# Patient Record
Sex: Female | Born: 1975 | Race: Black or African American | Hispanic: No | Marital: Married | Smoking: Never smoker
Health system: Southern US, Community
[De-identification: ages and names within clinical notes are randomized; demographics above are authoritative.]

## PROBLEM LIST (undated history)

## (undated) HISTORY — PX: CHOLECYSTECTOMY: SHX55

## (undated) HISTORY — PX: TONSILLECTOMY: SUR1361

## (undated) HISTORY — PX: BACK SURGERY: SHX140

---

## 2007-12-06 ENCOUNTER — Emergency Department (HOSPITAL_BASED_OUTPATIENT_CLINIC_OR_DEPARTMENT_OTHER): Admission: EM | Admit: 2007-12-06 | Discharge: 2007-12-06 | Payer: Self-pay | Admitting: Emergency Medicine

## 2007-12-27 ENCOUNTER — Ambulatory Visit (HOSPITAL_COMMUNITY): Admission: RE | Admit: 2007-12-27 | Discharge: 2007-12-28 | Payer: Self-pay | Admitting: Neurosurgery

## 2010-08-12 NOTE — Op Note (Signed)
NAME:  Sonya Sweeney, Sonya Sweeney NO.:  [id number]   MEDICAL RECORD NO.:  [id number]          PATIENT TYPE:  OIB   LOCATION:  3535                         FACILITY:  [facility]   PHYSICIAN:  Clydene Fake, M.D.  DATE OF BIRTH:  1976-01-11   DATE OF PROCEDURE:  12/27/2007  DATE OF DISCHARGE:                               OPERATIVE REPORT   PREOPERATIVE DIAGNOSIS:  Herniated nucleosis pulposus, stenosis  spondylosis, L5-S1 with radiculopathy.   POSTOPERATIVE DIAGNOSIS:  Herniated nucleosis pulposus, stenosis  spondylosis, L5-S1 with radiculopathy.   PROCEDURE:  Decompressive laminectomy, decompression at L5 and S1 roots  (two levels), diskectomy, and microdissection.   SURGEON:  Clydene Fake, MD   ASSISTANT:  Payton Doughty, MD.   ANESTHESIA:  General endotracheal tube anesthesia.   BLOOD LOSS:  Minimal.   BLOOD GIVEN:  None.   DRAINS:  None.   COMPLICATIONS:  None.   REASON FOR PROCEDURE:  The patient is a 35 year old woman who has had  back and left leg pain associated with numbness.  MRI was done showing  extremely large central disk herniation with further extruded fragment  towards the left causing severe stenosis and root compression, and the  patient brought in for decompression.   PROCEDURE IN DETAIL:  The patient was brought into the operating room  and general anesthesia was induced.  The patient was placed in a prone  position on a Wilson frame with all pressure points padded.  The patient  was prepped and draped in sterile fashion.  General anesthesia injected  with 10 mL of 1% lidocaine with epinephrine.  Needle was placed in  interspace.  X-rays obtained showing the needle pointing  below the 5-1 space.  Incision was then made centered just above where  the needle was, incision taken down to the fascia.  Hemostasis obtained  with Bovie cauterization.  The fascia was incised on the left side  subperiosteal dissection was done over the L5-S1 spinous  process lamina  out to facet, markers placed in interspace.  X-rays were obtained  showing where the 5-1 interspace.  We exposed the right side and self-  retaining retractor was placed.  Microscope was brought in for  microdissection.  High-speed drill was used to start decompressive semi  hemilaminectomy on the left __________  medial facetectomy, removing the  __________ S1-L5 and removed ligamentum flavum and explored the disk  space.  Nerve root was under tension.  There was a huge disk herniation  underneath it.  It was calcified.  We were able to remove some free  fragmented disk from the axilla, and this loosened nerve root.  Rather  than  retract the nerve root over we ended up using a curette to get  through the calcified disk and even using a high-speed drill and then we  were able to use the curette to push some of the calcified disk and  towards the disk space and removed the soft disk with pituitary  rongeurs.  Attention was then taken to the right side where we did a  semi hemilaminectomy.  __________ drill,  Kerrison punches removing  ligament flavum and again found large calcified disk and we were able to  enter the disk space to perform diskectomy and there going back to the  left side still seemed to be a large piece of free fragments under the  thecal sac and we were finally able to find this and able to remove it  and this area freed up of all space, closed and cold blood decompression  and nevertheless take adequate decompression of central canal and  bilateral L5 and nerve roots were well decompressed.  Hemostasis  obtained with Gelfoam and thrombin and bipolar cauterization.  Gelfoam  was irrigated out.  We irrigated with antibiotic solution and the  retractors were removed.  Fascia closed with 0 Vicryl interrupted  sutures.  Subcutaneous tissue closed with 2-0 and 3-0 Vicryl interrupted  sutures.  Skin closed with benzoin and Steri-Strips.  The dressing was   placed.  The patient was placed back in the supine position, awaken from  anesthesia and transferred to the recovery room in stable condition.           ______________________________  Clydene Fake, M.D.     JRH/MEDQ  D:  12/27/2007  T:  12/28/2007  Job:  [id number]

## 2010-12-29 LAB — BASIC METABOLIC PANEL
BUN: 9
Calcium: 9.2
Chloride: 105
Creatinine, Ser: 0.65

## 2010-12-29 LAB — CBC
MCHC: 33.6
MCV: 85.7
Platelets: 243
WBC: 5.2

## 2010-12-29 LAB — PROTIME-INR: Prothrombin Time: 13.4

## 2010-12-29 LAB — APTT: aPTT: 33

## 2010-12-29 LAB — PREGNANCY, URINE: Preg Test, Ur: NEGATIVE

## 2015-10-16 ENCOUNTER — Emergency Department: Payer: BLUE CROSS/BLUE SHIELD

## 2015-10-16 ENCOUNTER — Emergency Department
Admission: EM | Admit: 2015-10-16 | Discharge: 2015-10-16 | Disposition: A | Discharge status: Home / Self Care | Payer: BLUE CROSS/BLUE SHIELD | Attending: Emergency Medicine | Admitting: Emergency Medicine

## 2015-10-16 DIAGNOSIS — R42 Dizziness and giddiness: Secondary | ICD-10-CM | POA: Diagnosis present

## 2015-10-16 DIAGNOSIS — J019 Acute sinusitis, unspecified: Secondary | ICD-10-CM | POA: Insufficient documentation

## 2015-10-16 LAB — COMPREHENSIVE METABOLIC PANEL
ALBUMIN: 4.2 g/dL (ref 3.5–5.0)
ALK PHOS: 39 U/L (ref 38–126)
ALT: 17 U/L (ref 14–54)
ANION GAP: 7 (ref 5–15)
AST: 20 U/L (ref 15–41)
BILIRUBIN TOTAL: 0.4 mg/dL (ref 0.3–1.2)
BUN: 10 mg/dL (ref 6–20)
CALCIUM: 8.9 mg/dL (ref 8.9–10.3)
CO2: 26 mmol/L (ref 22–32)
Chloride: 107 mmol/L (ref 101–111)
Creatinine, Ser: 0.69 mg/dL (ref 0.44–1.00)
GFR calc non Af Amer: >60 mL/min (ref >60)
Glucose, Bld: 127 mg/dL — ABNORMAL HIGH (ref 65–99)
POTASSIUM: 3.4 mmol/L — AB (ref 3.5–5.1)
Sodium: 140 mmol/L (ref 135–145)
TOTAL PROTEIN: 7.6 g/dL (ref 6.5–8.1)

## 2015-10-16 LAB — URINALYSIS COMPLETE WITH MICROSCOPIC (ARMC ONLY)
BILIRUBIN URINE: NEGATIVE
Bacteria, UA: NONE SEEN
GLUCOSE, UA: NEGATIVE mg/dL
KETONES UR: NEGATIVE mg/dL
LEUKOCYTES UA: NEGATIVE
NITRITE: NEGATIVE
PH: 7 (ref 5.0–8.0)
Protein, ur: NEGATIVE mg/dL
SPECIFIC GRAVITY, URINE: 1.017 (ref 1.005–1.030)

## 2015-10-16 LAB — CBC WITH DIFFERENTIAL/PLATELET
BASOS PCT: 1 %
Basophils Absolute: 0 10*3/uL (ref 0–0.1)
EOS ABS: 0.2 10*3/uL (ref 0–0.7)
Eosinophils Relative: 3 %
HEMATOCRIT: 41 % (ref 35.0–47.0)
HEMOGLOBIN: 14.1 g/dL (ref 12.0–16.0)
LYMPHS ABS: 2.3 10*3/uL (ref 1.0–3.6)
Lymphocytes Relative: 42 %
MCH: 29.7 pg (ref 26.0–34.0)
MCHC: 34.3 g/dL (ref 32.0–36.0)
MCV: 86.8 fL (ref 80.0–100.0)
MONO ABS: 0.3 10*3/uL (ref 0.2–0.9)
MONOS PCT: 6 %
NEUTROS ABS: 2.7 10*3/uL (ref 1.4–6.5)
NEUTROS PCT: 48 %
Platelets: 228 10*3/uL (ref 150–440)
RBC: 4.73 MIL/uL (ref 3.80–5.20)
RDW: 13.3 % (ref 11.5–14.5)
WBC: 5.6 10*3/uL (ref 3.6–11.0)

## 2015-10-16 MED ORDER — DIAZEPAM 5 MG PO TABS: 5.0000 mg | ORAL_TABLET | Freq: Three times a day (TID) | ORAL | Status: AC | PRN

## 2015-10-16 MED ORDER — PREDNISONE 10 MG (21) PO TBPK: ORAL_TABLET | ORAL | Status: AC

## 2015-10-16 MED ORDER — MECLIZINE HCL 25 MG PO TABS
50.0000 mg | ORAL_TABLET | Freq: Once | ORAL | Status: AC
  Administered 2015-10-16: 50 mg via ORAL
  Filled 2015-10-16: qty 2

## 2015-10-16 MED ORDER — DIAZEPAM 5 MG PO TABS
5.0000 mg | ORAL_TABLET | Freq: Once | ORAL | Status: AC
  Administered 2015-10-16: 5 mg via ORAL
  Filled 2015-10-16: qty 1

## 2015-10-16 MED ORDER — AZITHROMYCIN 250 MG PO TABS: ORAL_TABLET | ORAL | Status: AC

## 2015-10-16 MED ORDER — SODIUM CHLORIDE 0.9 % IV SOLN
1000.0000 mL | Freq: Once | INTRAVENOUS | Status: AC
  Administered 2015-10-16: 1000 mL via INTRAVENOUS

## 2015-10-16 MED ORDER — MECLIZINE HCL 25 MG PO TABS: 25.0000 mg | ORAL_TABLET | Freq: Three times a day (TID) | ORAL | Status: AC | PRN

## 2015-10-16 NOTE — ED Notes (Signed)
Pt to ED via EMS c/o dizziness. Per EMS [place] [address] and had to pull over several times because she was feeling increasingly dizzy. EMS stated at the scene pt was "laying all the way back in her drivers seat fanning herself". EMS stated pt ate this morning and CBG was 117.

## 2015-10-16 NOTE — ED Notes (Signed)
Patient at CT

## 2015-10-16 NOTE — Discharge Instructions (Signed)
Vertigo Vertigo means you feel like you or your surroundings are moving when they are not. Vertigo can be dangerous if it occurs when you are at work, driving, or performing difficult activities.  CAUSES  Vertigo occurs when there is a conflict of signals sent to your brain from the visual and sensory systems in your body. There are many different causes of vertigo, including:  Infections, especially in the inner ear.  A bad reaction to a drug or misuse of alcohol and medicines.  Withdrawal from drugs or alcohol.  Rapidly changing positions, such as lying down or rolling over in bed.  A migraine headache.  Decreased blood flow to the brain.  Increased pressure in the brain from a head injury, infection, tumor, or bleeding. SYMPTOMS  You may feel as though the world is spinning around or you are falling to the ground. Because your balance is upset, vertigo can cause nausea and vomiting. You may have involuntary eye movements (nystagmus). DIAGNOSIS  Vertigo is usually diagnosed by physical exam. If the cause of your vertigo is unknown, your caregiver may perform imaging tests, such as an MRI scan (magnetic resonance imaging). TREATMENT  Most cases of vertigo resolve on their own, without treatment. Depending on the cause, your caregiver may prescribe certain medicines. If your vertigo is related to body position issues, your caregiver may recommend movements or procedures to correct the problem. In rare cases, if your vertigo is caused by certain inner ear problems, you may need surgery. HOME CARE INSTRUCTIONS   Follow your caregiver's instructions.  Avoid driving.  Avoid operating heavy machinery.  Avoid performing any tasks that would be dangerous to you or others during a vertigo episode.  Tell your caregiver if you notice that certain medicines seem to be causing your vertigo. Some of the medicines used to treat vertigo episodes can actually make them worse in some people. SEEK  IMMEDIATE MEDICAL CARE IF:   Your medicines do not relieve your vertigo or are making it worse.  You develop problems with talking, walking, weakness, or using your arms, hands, or legs.  You develop severe headaches.  Your nausea or vomiting continues or gets worse.  You develop visual changes.  A family member notices behavioral changes.  Your condition gets worse. MAKE SURE YOU:  Understand these instructions.  Will watch your condition.  Will get help right away if you are not doing well or get worse.   This information is not intended to replace advice given to you by your health care provider. Make sure you discuss any questions you have with your health care provider.   Document Released: 12/24/2004 Document Revised: 06/08/2011 Document Reviewed: 07/09/2014 Elsevier Interactive Patient Education 2016 Elsevier Inc.  Dizziness Dizziness is a common problem. It is a feeling of unsteadiness or light-headedness. You may feel like you are about to faint. Dizziness can lead to injury if you stumble or fall. Anyone can become dizzy, but dizziness is more common in older adults. This condition can be caused by a number of things, including medicines, dehydration, or illness. HOME CARE INSTRUCTIONS Taking these steps may help with your condition: Eating and Drinking  Drink enough fluid to keep your urine clear or pale yellow. This helps to keep you from becoming dehydrated. Try to drink more clear fluids, such as water.  Do not drink alcohol.  Limit your caffeine intake if directed by your health care provider.  Limit your salt intake if directed by your health care provider. Activity  Avoid making quick movements.  Rise slowly from chairs and steady yourself until you feel okay.  In the morning, first sit up on the side of the bed. When you feel okay, stand slowly while you hold onto something until you know that your balance is fine.  Move your legs often if you need  to stand in one place for a long time. Tighten and relax your muscles in your legs while you are standing.  Do not drive or operate heavy machinery if you feel dizzy.  Avoid bending down if you feel dizzy. Place items in your home so that they are easy for you to reach without leaning over. Lifestyle  Do not use any tobacco products, including cigarettes, chewing tobacco, or electronic cigarettes. If you need help quitting, ask your health care provider.  Try to reduce your stress level, such as with yoga or meditation. Talk with your health care provider if you need help. General Instructions  Watch your dizziness for any changes.  Take medicines only as directed by your health care provider. Talk with your health care provider if you think that your dizziness is caused by a medicine that you are taking.  Tell a friend or a family member that you are feeling dizzy. If he or she notices any changes in your behavior, have this person call your health care provider.  Keep all follow-up visits as directed by your health care provider. This is important. SEEK MEDICAL CARE IF:  Your dizziness does not go away.  Your dizziness or light-headedness gets worse.  You feel nauseous.  You have reduced hearing.  You have new symptoms.  You are unsteady on your feet or you feel like the room is spinning. SEEK IMMEDIATE MEDICAL CARE IF:  You vomit or have diarrhea and are unable to eat or drink anything.  You have problems talking, walking, swallowing, or using your arms, hands, or legs.  You feel generally weak.  You are not thinking clearly or you have trouble forming sentences. It may take a friend or family member to notice this.  You have chest pain, abdominal pain, shortness of breath, or sweating.  Your vision changes.  You notice any bleeding.  You have a headache.  You have neck pain or a stiff neck.  You have a fever.   This information is not intended to replace  advice given to you by your health care provider. Make sure you discuss any questions you have with your health care provider.   Document Released: 09/09/2000 Document Revised: 07/31/2014 Document Reviewed: 03/12/2014 Elsevier Interactive Patient Education [employer]2016 Elsevier Inc.

## 2015-10-16 NOTE — ED Provider Notes (Addendum)
[facility] Emergency Department Provider Note        Time seen: ----------------------------------------- 9:18 AM on 10/16/2015 -----------------------------------------    I have reviewed the triage vital signs and the nursing notes.   HISTORY  Chief Complaint Dizziness    HPI Sonya Sweeney is a 40 y.o. female who presents to the ER for dizziness. Patient states she was driving on the highway and she had to pull over several times because she was feeling increasingly dizzy. EMS states that she was laying all the way back in the driver seat fanning herself. Patient states she's had these symptoms over the last week as well. She describes particular difficulty with walking downstairs where she feels that she's been a fall forward. She's also had these symptoms sitting at her desk, describes her vision becoming blurry, feeling like she can't breathe. She also describes feeling like she's can pass out.   History reviewed. No pertinent past medical history.  There are no active problems to display for this patient.   Past Surgical History  Procedure Laterality Date  . Cholecystectomy    . Back surgery    . Tonsillectomy      Allergies Penicillins  Social History Social History  Substance Use Topics  . Smoking status: Never Smoker   . Smokeless tobacco: None  . Alcohol Use: No    Review of Systems Constitutional: Negative for fever. Cardiovascular: Negative for chest pain. Respiratory: Positive for trouble breathing Gastrointestinal: Negative for abdominal pain, vomiting and diarrhea. Genitourinary: Negative for dysuria. Musculoskeletal: Negative for back pain. Skin: Negative for rash. Neurological: Positive for dizziness  10-point ROS otherwise negative.  ____________________________________________   PHYSICAL EXAM:  VITAL SIGNS: ED Triage Vitals  Enc Vitals Group     BP --      Pulse --      Resp --      Temp 10/16/15 0911 99  F (37.2 C)     Temp Source 10/16/15 0911 Oral     SpO2 --      Weight --      Height --      Head Cir --      Peak Flow --      Pain Score --      Pain Loc --      Pain Edu? --      Excl. in GC? --     Constitutional: Alert and oriented. Well appearing and in no distress. Eyes: Conjunctivae are normal. PERRL. Normal extraocular movements. ENT   Head: Normocephalic and atraumatic.   Nose: No congestion/rhinnorhea.   Mouth/Throat: Mucous membranes are moist.   Neck: No stridor. Cardiovascular: Normal rate, regular rhythm. No murmurs, rubs, or gallops. Respiratory: Normal respiratory effort without tachypnea nor retractions. Breath sounds are clear and equal bilaterally. No wheezes/rales/rhonchi. Gastrointestinal: Soft and nontender. Normal bowel sounds Musculoskeletal: Nontender with normal range of motion in all extremities. No lower extremity tenderness nor edema. Neurologic:  Normal speech and language. No gross focal neurologic deficits are appreciated. Strength, sensation, cranial nerves appear intact Skin:  Skin is warm, dry and intact. No rash noted. Psychiatric: Mood and affect are normal. Speech and behavior are normal.  ____________________________________________  EKG: Interpreted by me. Sinus rhythm with a rate of 70 bpm, normal PR interval, normal QRS, normal QT interval. Normal EKG.  ____________________________________________  ED COURSE:  Pertinent labs & imaging results that were available during my care of the patient were reviewed by me and considered in my medical  decision making (see chart for details). Patient presents to ER with nonspecific dizziness. I will check basic labs, CT imaging. She will receive meclizine and Valium. ____________________________________________    LABS (pertinent positives/negatives)  Labs Reviewed  COMPREHENSIVE METABOLIC PANEL - Abnormal; Notable for the following:    Potassium 3.4 (*)    Glucose, Bld 127 (*)     All other components within normal limits  URINALYSIS COMPLETEWITH MICROSCOPIC ([facility] ONLY) - Abnormal; Notable for the following:    Color, Urine YELLOW (*)    APPearance CLEAR (*)    Hgb urine dipstick 2+ (*)    Squamous Epithelial / LPF 0-5 (*)    All other components within normal limits  CBC WITH DIFFERENTIAL/PLATELET    RADIOLOGY Images were viewed by me  CT head IMPRESSION: Mild sphenoid paranasal sinus disease and mild opacification of several inferior mastoid air cells on the left. No intracranial mass, hemorrhage, or focal gray - white compartment lesion.  ____________________________________________  FINAL ASSESSMENT AND PLAN  Dizziness, Sinusitis  Plan: Patient with labs and imaging as dictated above. Patient was able to ambulate here and states she feels better. She has also been under a lot of stress, was taking her daughter to college for the first time. She'll be discharged with several medications including Azithromycin, prednisone, meclizine and Valium. She'll be referred to ENT for close outpatient follow-up.   Emily Filbert, MD   Note: This dictation was prepared with Dragon dictation. Any transcriptional errors that result from this process are unintentional   Emily Filbert, MD 10/16/15 1124  Emily Filbert, MD 10/16/15 1125

## 2015-10-16 NOTE — ED Notes (Signed)
MD at bedside. 

## 2017-05-12 ENCOUNTER — Emergency Department (HOSPITAL_COMMUNITY)
Admission: EM | Admit: 2017-05-12 | Discharge: 2017-05-13 | Disposition: A | Discharge status: Home / Self Care | Payer: BLUE CROSS/BLUE SHIELD | Attending: Emergency Medicine | Admitting: Emergency Medicine

## 2017-05-12 ENCOUNTER — Emergency Department (HOSPITAL_COMMUNITY): Payer: BLUE CROSS/BLUE SHIELD

## 2017-05-12 ENCOUNTER — Encounter (HOSPITAL_COMMUNITY): Payer: Self-pay | Admitting: Emergency Medicine

## 2017-05-12 ENCOUNTER — Other Ambulatory Visit: Payer: Self-pay

## 2017-05-12 DIAGNOSIS — M79601 Pain in right arm: Secondary | ICD-10-CM | POA: Diagnosis not present

## 2017-05-12 DIAGNOSIS — Z5321 Procedure and treatment not carried out due to patient leaving prior to being seen by health care provider: Secondary | ICD-10-CM | POA: Diagnosis not present

## 2017-05-12 DIAGNOSIS — R51 Headache: Secondary | ICD-10-CM | POA: Diagnosis present

## 2017-05-12 DIAGNOSIS — R519 Headache, unspecified: Secondary | ICD-10-CM

## 2017-05-12 NOTE — ED Provider Notes (Cosign Needed)
Patient placed in Quick Look pathway, seen and evaluated   Chief Complaint: Headache, right arm pain  HPI:   Meridee ScoreJoanne Surges is a 42 y.o. female who presents to the Emergency Department complaining of right-sided headache which began 5 days ago. She took Excerdrin migraine and pain improved, but then returned and is now bilateral. It has hurt daily. She then began experiencing pain and tingling down the right side of her arm. She feels as if this has improved, but feels an itching sensation to her right arm now. Denies any history of headaches in the past.   ROS: + headaches, myalgias, tingling   - fever, visual changes, weakness  Physical Exam:   Gen: No distress  Skin: Warm     Focused Exam:    Neuro: Speech clear and goal oriented. CN 2-12 grossly intact. Normal finger-to-nose and rapid alternating movements. No drift. Strength and sensation intact. Steady gait.    Neck: Supple, mild tenderness to cervical spinous processes.   Initiation of care has begun. The patient has been counseled on the process, plan, and necessity for staying for the completion/evaluation, and the remainder of the medical screening examination  Given patient has no history of migrainous headaches in the past, will obtain CT head. She did have mild midline cervical tenderness and experiencing right arm pain / tingling, therefore CT neck obtained as well. Offered patient medication for symptom improvement while waiting, however she declined.     Mujtaba Bollig, Chase PicketJaime Pilcher, PA-C 05/12/17 [id number]

## 2017-05-12 NOTE — ED Notes (Signed)
No answer x2 

## 2017-05-12 NOTE — ED Notes (Signed)
No answer x 2 when called by Eulis FosterAlberto, NT.

## 2017-05-12 NOTE — ED Triage Notes (Signed)
Pt presents with ongoing headache and tingling that began on Saturday; pt states headache is R sided and tingling in the R arm and hand; pt states she took some migraine medication but didn't help

## 2017-05-14 NOTE — ED Notes (Signed)
05/14/2017, Follow-up call completed. She will be following up with her PCP.

## 2019-01-05 ENCOUNTER — Other Ambulatory Visit: Payer: Self-pay

## 2019-01-05 DIAGNOSIS — Z20822 Contact with and (suspected) exposure to covid-19: Secondary | ICD-10-CM

## 2019-01-07 LAB — NOVEL CORONAVIRUS, NAA: SARS-CoV-2, NAA: NOT DETECTED

## 2019-06-09 IMAGING — CT CT CERVICAL SPINE W/O CM
3 of 8 series · 11 of 33 positions shown, 12 images · non-contrast
Comparison: Head CT 10/16/2015.

CLINICAL DATA: 41-year-old female with history of numbness and
tingling in the right arm.

EXAM:
CT HEAD WITHOUT CONTRAST
CT CERVICAL SPINE WITHOUT CONTRAST
TECHNIQUE: Multidetector CT imaging of the head and cervical spine was
performed following the standard protocol without intravenous
contrast. Multiplanar CT image reconstructions of the cervical spine
were also generated.

[Series 10: sag bone · sagittal · 0.21mm/px · 5 of 61 slices shown]
[im 11/61  bone]
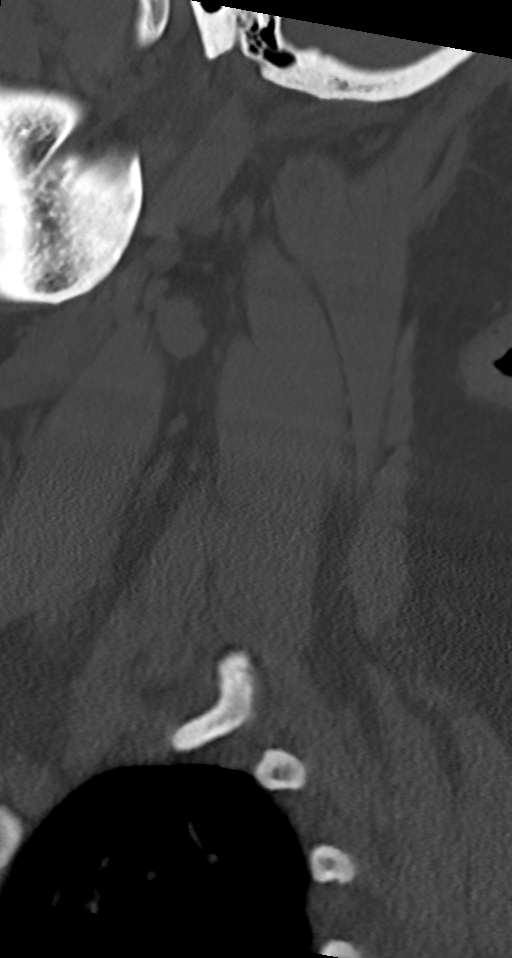
[im 21/61  bone]
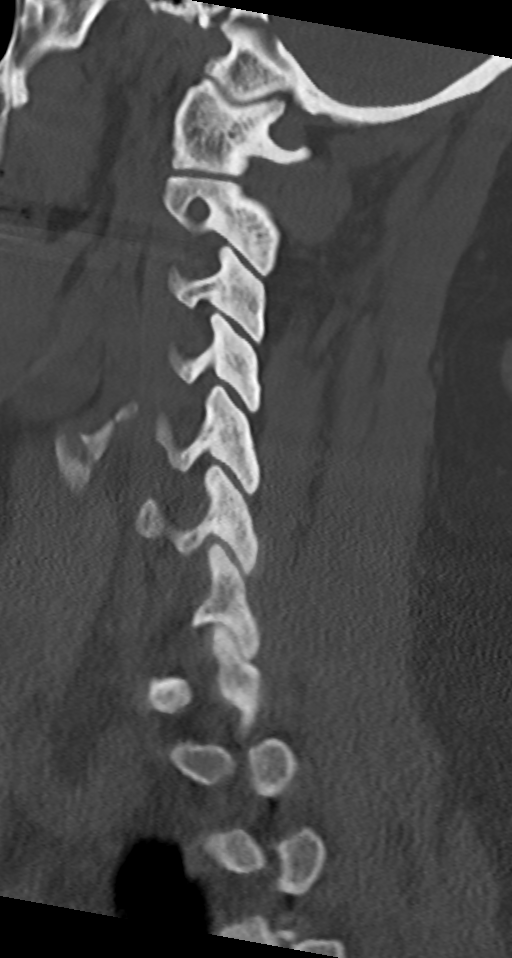
[im 31/61  bone]
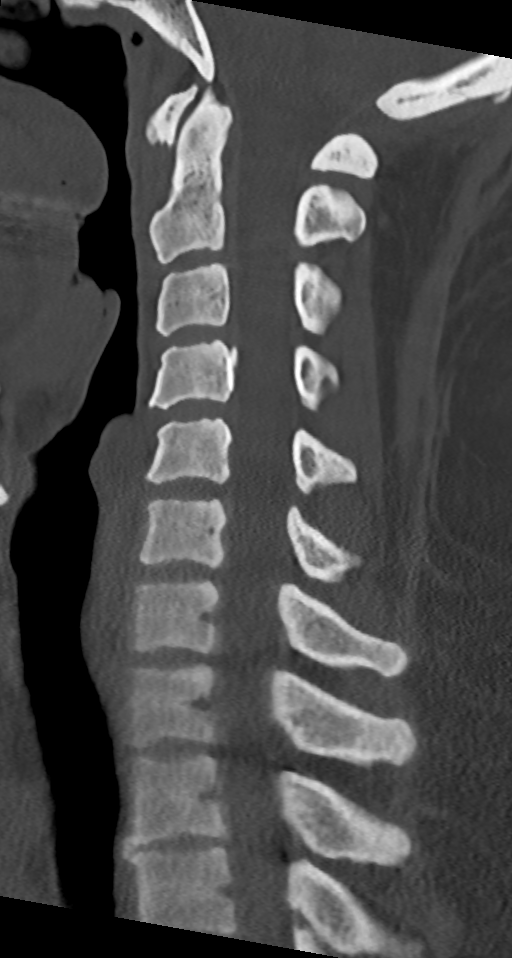
[im 41/61  bone]
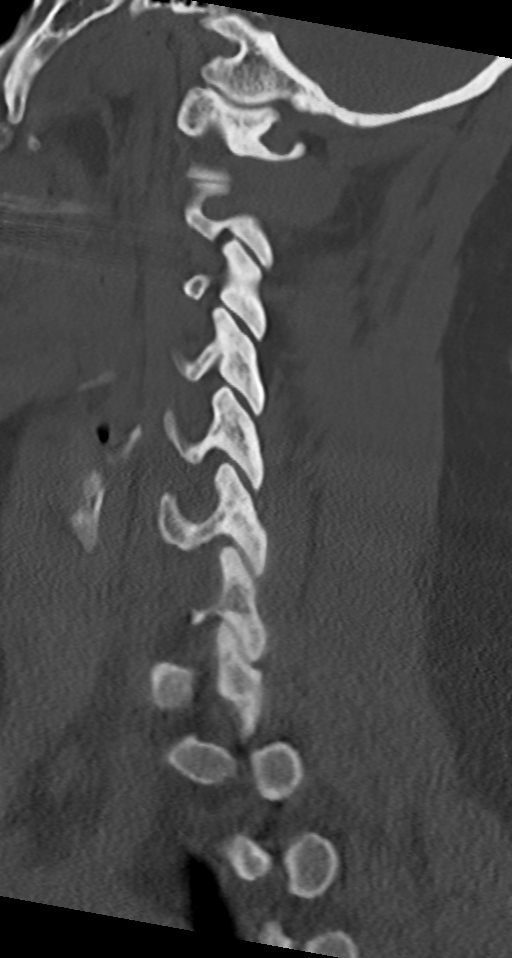
[im 51/61  bone]
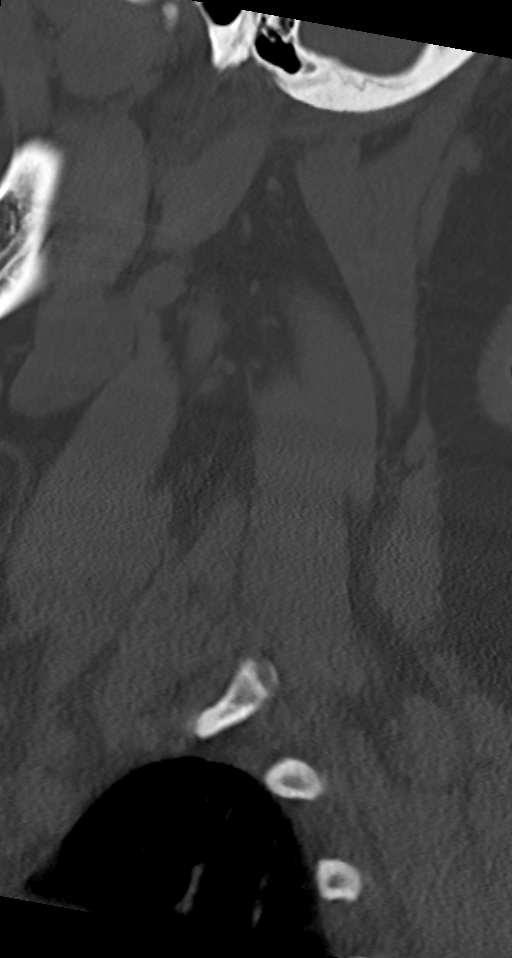

[Series 11: cor bone · coronal · 0.22mm/px · 3 of 61 slices shown]
[im 16/61  bone]
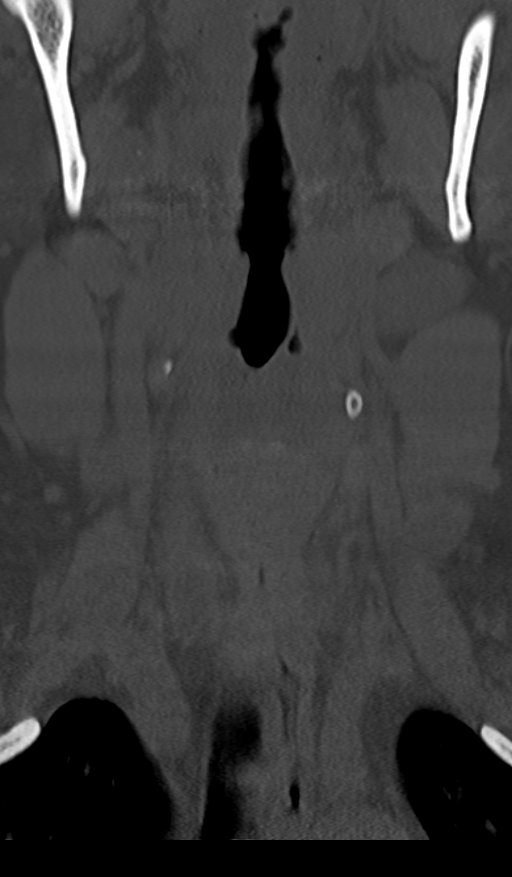
[im 31/61  bone]
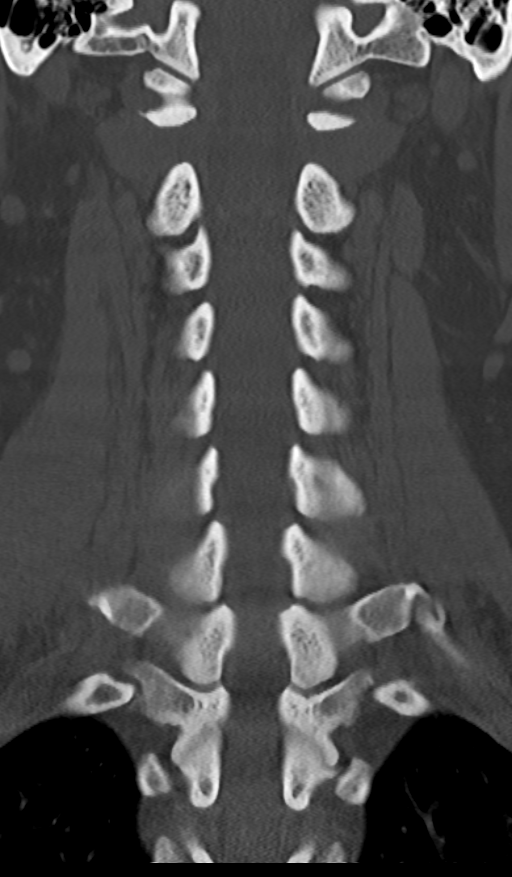
[im 46/61  bone]
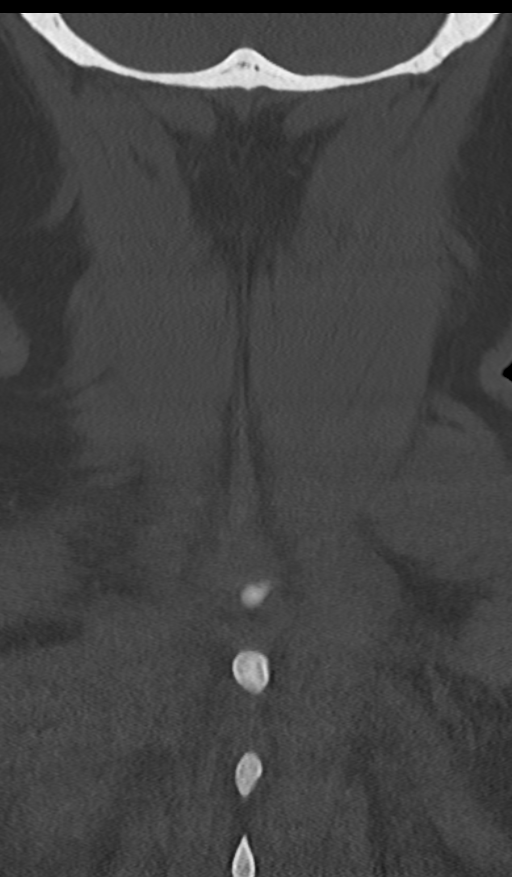

[Series 12: orthogonal axials · axial · 0.21mm/px · z∈[-209,-115]mm · 3 of 101 slices shown, 4 images]
[im 26/101  soft-tissue]
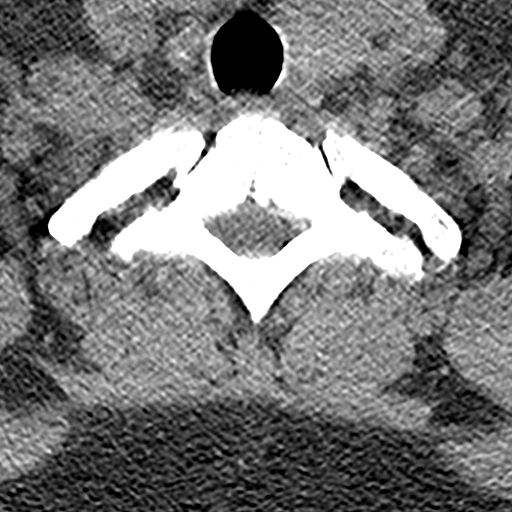
[im 26/101  bone]
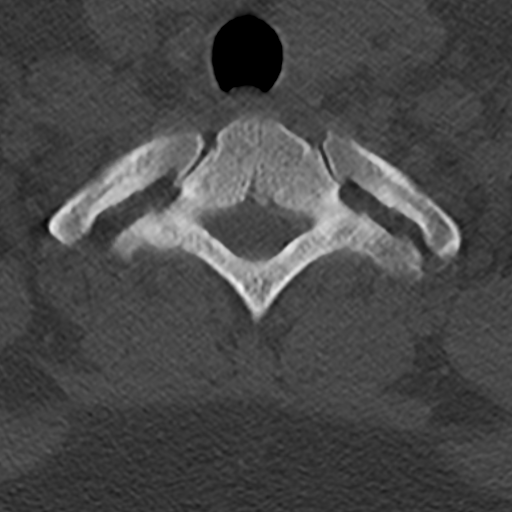
[im 51/101  bone]
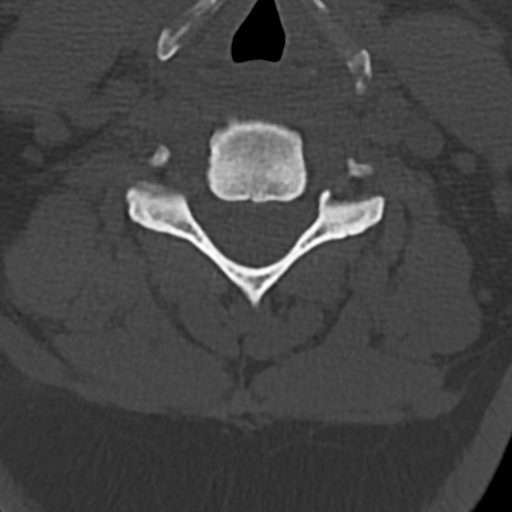
[im 76/101  bone]
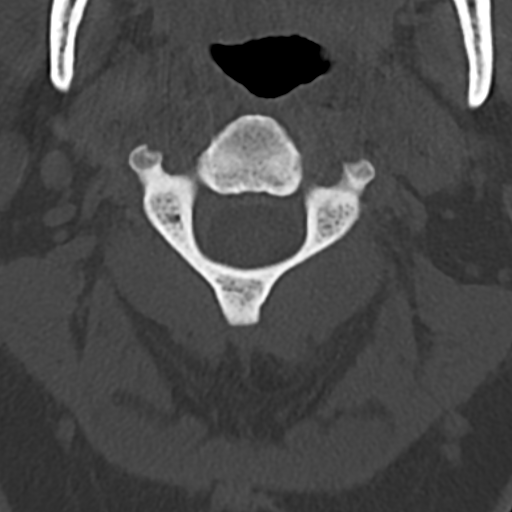

[11 of 33 positions shown; findings below may reference images not displayed]

FINDINGS: CT HEAD FINDINGS

Brain: No evidence of acute infarction, hemorrhage, hydrocephalus,
extra-axial collection or mass lesion/mass effect.

Vascular: No hyperdense vessel or unexpected calcification.

Skull: Normal. Negative for fracture or focal lesion.

Sinuses/Orbits: No acute finding.

Other: None.

CT CERVICAL SPINE FINDINGS

Alignment: Normal.

Skull base and vertebrae: No acute fracture. No primary bone lesion
or focal pathologic process.

Soft tissues and spinal canal: No prevertebral fluid or swelling. No
visible canal hematoma.

Disc levels:  No significant degenerative disc disease.

Upper chest: Visualized portions are unremarkable.

Other: None.
IMPRESSION: 1. No acute abnormality of the skull, brain or cervical spine.
2. The appearance of the brain is normal.

## 2020-10-23 ENCOUNTER — Encounter (HOSPITAL_COMMUNITY): Payer: Self-pay

## 2020-10-23 ENCOUNTER — Other Ambulatory Visit: Payer: Self-pay

## 2020-10-23 ENCOUNTER — Ambulatory Visit (HOSPITAL_COMMUNITY)
Admission: EM | Admit: 2020-10-23 | Discharge: 2020-10-23 | Disposition: A | Discharge status: Home / Self Care | Payer: BLUE CROSS/BLUE SHIELD | Attending: Medical Oncology | Admitting: Medical Oncology

## 2020-10-23 DIAGNOSIS — N898 Other specified noninflammatory disorders of vagina: Secondary | ICD-10-CM | POA: Insufficient documentation

## 2020-10-23 MED ORDER — METRONIDAZOLE 500 MG PO TABS: 500.0000 mg | ORAL_TABLET | Freq: Two times a day (BID) | ORAL | 0 refills | Status: AC

## 2020-10-23 NOTE — ED Triage Notes (Signed)
Pt presents with c/o vaginal itching x 1 week.  Denies vaginal smell and burning.   States she tried Monistat and states it made the itching worse.

## 2020-10-23 NOTE — ED Provider Notes (Signed)
[facility]    CSN: [id number] Arrival date & time: 10/23/20  1748      History   Chief Complaint Chief Complaint  Patient presents with   Vaginal Itching    HPI Sonya Sweeney is a 45 y.o. female.   HPI  Vaginal Itching: Patient states that she has had 1 week history of vaginal itching and irritation.  She reports that she tried an over-the-counter Monistat treatment course but symptoms appear to worsen.  She denies any dysuria, pelvic pain or fevers.  No sexual partners of concern but wants STI testing.  History reviewed. No pertinent past medical history.  There are no problems to display for this patient.   Past Surgical History:  Procedure Laterality Date   BACK SURGERY     CHOLECYSTECTOMY     TONSILLECTOMY      OB History   No obstetric history on file.      Home Medications    Prior to Admission medications   Medication Sig Start Date End Date Taking? Authorizing Provider  azithromycin (ZITHROMAX Z-PAK) 250 MG tablet Take 2 tablets (500 mg) on  Day 1,  followed by 1 tablet (250 mg) once daily on Days 2 through 5. 10/16/15   Emily Filbert, MD  diazepam (VALIUM) 5 MG tablet Take 1 tablet (5 mg total) by mouth every 8 (eight) hours as needed (To be taken with meclizine for vertigo). 10/16/15   Emily Filbert, MD  meclizine (ANTIVERT) 25 MG tablet Take 1 tablet (25 mg total) by mouth 3 (three) times daily as needed for dizziness or nausea. 10/16/15   Emily Filbert, MD  predniSONE (STERAPRED UNI-PAK 21 TAB) 10 MG (21) TBPK tablet Dispense steroid taper pack as directed 10/16/15   Emily Filbert, MD    Family History Family History  Problem Relation Age of Onset   Hypertension Mother     Social History Social History   Tobacco Use   Smoking status: Never   Smokeless tobacco: Never  Substance Use Topics   Alcohol use: No   Drug use: No     Allergies   Penicillins   Review of Systems Review of Systems  As  stated above in HPI Physical Exam Triage Vital Signs ED Triage Vitals  Enc Vitals Group     BP 10/23/20 1835 (!) 157/104     Pulse Rate 10/23/20 1834 84     Resp 10/23/20 1834 17     Temp 10/23/20 1834 98.9 F (37.2 C)     Temp Source 10/23/20 1834 Oral     SpO2 10/23/20 1834 100 %     Weight --      Height --      Head Circumference --      Peak Flow --      Pain Score 10/23/20 1833 0     Pain Loc --      Pain Edu? --      Excl. in GC? --    No data found.  Updated Vital Signs BP (!) 157/104 (BP Location: Right Arm) Comment: Pt states she ate pork today.  Pulse 84   Temp 98.9 F (37.2 C) (Oral)   Resp 17   LMP 10/16/2015   SpO2 100%   Physical Exam Vitals and nursing note reviewed.  Constitutional:      General: She is not in acute distress.    Appearance: Normal appearance. She is not ill-appearing, toxic-appearing or diaphoretic.  Genitourinary:  Comments: Pt perform self swab collection prior to visit  Neurological:     Mental Status: She is alert.     UC Treatments / Results  Labs (all labs ordered are listed, but only abnormal results are displayed) Labs Reviewed - No data to display  EKG   Radiology No results found.  Procedures Procedures (including critical care time)  Medications Ordered in UC Medications - No data to display  Initial Impression / Assessment and Plan / UC Course  I have reviewed the triage vital signs and the nursing notes.  Pertinent labs & imaging results that were available during my care of the patient were reviewed by me and considered in my medical decision making (see chart for details).     New.  Likely bacterial vaginosis given that symptoms worsen with the Monistat and she has no known new partners of concern.  I discussed waiting until results are back versus treating now.  She elects treatment now.  Treating with Flagyl.  Discussed how to use along common potential side effects and precautions.  Discussed red  flag signs symptoms. Final Clinical Impressions(s) / UC Diagnoses   Final diagnoses:  None   Discharge Instructions   None    ED Prescriptions   None    PDMP not reviewed this encounter.   Rushie Chestnut, [place] 10/23/20 1920

## 2020-10-25 ENCOUNTER — Telehealth (HOSPITAL_COMMUNITY): Payer: Self-pay | Admitting: Emergency Medicine

## 2020-10-25 LAB — CERVICOVAGINAL ANCILLARY ONLY
Bacterial Vaginitis (gardnerella): NEGATIVE
Candida Glabrata: NEGATIVE
Candida Vaginitis: POSITIVE — AB
Chlamydia: NEGATIVE
Comment: NEGATIVE
Comment: NEGATIVE
Comment: NEGATIVE
Comment: NEGATIVE
Comment: NEGATIVE
Comment: NORMAL
Neisseria Gonorrhea: NEGATIVE
Trichomonas: NEGATIVE

## 2020-10-25 MED ORDER — FLUCONAZOLE 150 MG PO TABS: 150.0000 mg | ORAL_TABLET | Freq: Once | ORAL | 0 refills | Status: AC

## 2022-09-27 ENCOUNTER — Emergency Department (HOSPITAL_COMMUNITY): Payer: BC Managed Care – PPO

## 2022-09-27 ENCOUNTER — Encounter (HOSPITAL_COMMUNITY): Payer: Self-pay

## 2022-09-27 ENCOUNTER — Emergency Department (HOSPITAL_COMMUNITY)
Admission: EM | Admit: 2022-09-27 | Discharge: 2022-09-27 | Disposition: A | Discharge status: Home / Self Care | Payer: BC Managed Care – PPO | Attending: Emergency Medicine | Admitting: Emergency Medicine

## 2022-09-27 DIAGNOSIS — I10 Essential (primary) hypertension: Secondary | ICD-10-CM | POA: Diagnosis not present

## 2022-09-27 DIAGNOSIS — M542 Cervicalgia: Secondary | ICD-10-CM | POA: Diagnosis not present

## 2022-09-27 DIAGNOSIS — R519 Headache, unspecified: Secondary | ICD-10-CM | POA: Insufficient documentation

## 2022-09-27 DIAGNOSIS — Z79899 Other long term (current) drug therapy: Secondary | ICD-10-CM | POA: Insufficient documentation

## 2022-09-27 LAB — I-STAT CHEM 8, ED
BUN: 11 mg/dL (ref 6–20)
Calcium, Ion: 1.16 mmol/L (ref 1.15–1.40)
Chloride: 104 mmol/L (ref 98–111)
Creatinine, Ser: 0.8 mg/dL (ref 0.44–1.00)
Glucose, Bld: 122 mg/dL — ABNORMAL HIGH (ref 70–99)
HCT: 36 % (ref 36.0–46.0)
Hemoglobin: 12.2 g/dL (ref 12.0–15.0)
Potassium: 3.1 mmol/L — ABNORMAL LOW (ref 3.5–5.1)
Sodium: 143 mmol/L (ref 135–145)
TCO2: 25 mmol/L (ref 22–32)

## 2022-09-27 MED ORDER — AMLODIPINE BESYLATE 5 MG PO TABS: 5.0000 mg | ORAL_TABLET | Freq: Every day | ORAL | 0 refills | Status: AC

## 2022-09-27 MED ORDER — HYDROCODONE-ACETAMINOPHEN 5-325 MG PO TABS
1.0000 | ORAL_TABLET | Freq: Once | ORAL | Status: AC
  Administered 2022-09-27: 1 via ORAL
  Filled 2022-09-27: qty 1

## 2022-09-27 MED ORDER — IOHEXOL 350 MG/ML SOLN
80.0000 mL | Freq: Once | INTRAVENOUS | Status: AC | PRN
  Administered 2022-09-27: 80 mL via INTRAVENOUS

## 2022-09-27 NOTE — Discharge Instructions (Signed)
You can use Tylenol and ibuprofen for symptomatic relief.  Follow-up with your primary care doctor this week to recheck her blood pressure and recheck the sore spot on your head.  Return to the emergency room if you have any worsening symptoms.

## 2022-09-27 NOTE — ED Notes (Signed)
Dr Fredderick Phenix is aware of patient's elevated BP of 197/113 and pain in the back of her head.

## 2022-09-27 NOTE — ED Provider Notes (Signed)
[place] EMERGENCY DEPARTMENT AT [facility] ([place]) Provider Note   CSN: [id number] Arrival date & time: 09/27/22  1409     History  Chief Complaint  Patient presents with   Neck Pain    Sonya Sweeney is a 47 y.o. female.  Patient is a 47 year old female who presents with head and neck pain.  She said that she woke up 2 days ago with some pain in the back of her head.  She says it is sore and it seems to radiate down her neck although she cannot reproduce the tenderness in her neck.  She says it has been getting worse over the last 2 days.  She has some worsening pain with certain movements.  She denies any vision changes.  No radiation down her arms.  No numbness or weakness in her arms.  No numbness to her face.  No known injuries.       Home Medications Prior to Admission medications   Medication Sig Start Date End Date Taking? Authorizing Provider  amLODipine (NORVASC) 5 MG tablet Take 1 tablet (5 mg total) by mouth daily. 09/27/22  Yes Rolan Bucco, MD  azithromycin (ZITHROMAX Z-PAK) 250 MG tablet Take 2 tablets (500 mg) on  Day 1,  followed by 1 tablet (250 mg) once daily on Days 2 through 5. 10/16/15   Emily Filbert, MD  diazepam (VALIUM) 5 MG tablet Take 1 tablet (5 mg total) by mouth every 8 (eight) hours as needed (To be taken with meclizine for vertigo). 10/16/15   Emily Filbert, MD  meclizine (ANTIVERT) 25 MG tablet Take 1 tablet (25 mg total) by mouth 3 (three) times daily as needed for dizziness or nausea. 10/16/15   Emily Filbert, MD  metroNIDAZOLE (FLAGYL) 500 MG tablet Take 1 tablet (500 mg total) by mouth 2 (two) times daily. 10/23/20   Rushie Chestnut, PA-C  predniSONE (STERAPRED UNI-PAK 21 TAB) 10 MG (21) TBPK tablet Dispense steroid taper pack as directed 10/16/15   Emily Filbert, MD      Allergies    Penicillins    Review of Systems   Review of Systems  Constitutional:  Negative for chills, diaphoresis, fatigue and fever.   HENT:  Negative for congestion, rhinorrhea and sneezing.   Eyes: Negative.   Respiratory:  Negative for cough, chest tightness and shortness of breath.   Cardiovascular:  Negative for chest pain and leg swelling.  Gastrointestinal:  Negative for abdominal pain, blood in stool, diarrhea, nausea and vomiting.  Genitourinary:  Negative for difficulty urinating, flank pain, frequency and hematuria.  Musculoskeletal:  Positive for neck pain. Negative for arthralgias and back pain.  Skin:  Negative for rash.  Neurological:  Positive for headaches. Negative for dizziness, speech difficulty, weakness and numbness.    Physical Exam Updated Vital Signs BP (!) 185/115 (BP Location: Left Arm)   Pulse 78   Temp 97.8 F (36.6 C) (Oral)   Resp 17   LMP 10/16/2015   SpO2 98%  Physical Exam Constitutional:      Appearance: She is well-developed.  HENT:     Head: Normocephalic and atraumatic.  Eyes:     Pupils: Pupils are equal, round, and reactive to light.  Neck:     Comments: Numbness to the posterior scalp area.  Its on the left lower scalp in the occipital area.  She does not have any palpable tenderness to her neck.  No reproducible spinal tenderness. Cardiovascular:     Rate  and Rhythm: Normal rate and regular rhythm.     Heart sounds: Normal heart sounds.  Pulmonary:     Effort: Pulmonary effort is normal. No respiratory distress.     Breath sounds: Normal breath sounds. No wheezing or rales.  Chest:     Chest wall: No tenderness.  Abdominal:     General: Bowel sounds are normal.     Palpations: Abdomen is soft.     Tenderness: There is no abdominal tenderness. There is no guarding or rebound.  Musculoskeletal:        General: Normal range of motion.     Cervical back: Normal range of motion and neck supple.  Lymphadenopathy:     Cervical: No cervical adenopathy.  Skin:    General: Skin is warm and dry.     Findings: No rash.  Neurological:     Mental Status: She is alert  and oriented to person, place, and time.     Comments: Motor 5/5 all extremities Sensation grossly intact to LT all extremities Finger to Nose intact, no pronator drift CN II-XII grossly intact       ED Results / Procedures / Treatments   Labs (all labs ordered are listed, but only abnormal results are displayed) Labs Reviewed  I-STAT CHEM 8, ED - Abnormal; Notable for the following components:      Result Value   Potassium 3.1 (*)    Glucose, Bld 122 (*)    All other components within normal limits    EKG None  Radiology CT Angio Head Neck W WO CM  Result Date: 09/27/2022 CLINICAL DATA:  Vertebral artery dissection suspected. Headache and neck pain radiating to the back and shoulders. EXAM: CT ANGIOGRAPHY HEAD AND NECK WITH AND WITHOUT CONTRAST TECHNIQUE: Multidetector CT imaging of the head and neck was performed using the standard protocol during bolus administration of intravenous contrast. Multiplanar CT image reconstructions and MIPs were obtained to evaluate the vascular anatomy. Carotid stenosis measurements (when applicable) are obtained utilizing NASCET criteria, using the distal internal carotid diameter as the denominator. RADIATION DOSE REDUCTION: This exam was performed according to the departmental dose-optimization program which includes automated exposure control, adjustment of the mA and/or kV according to patient size and/or use of iterative reconstruction technique. CONTRAST:  80mL OMNIPAQUE IOHEXOL 350 MG/ML SOLN COMPARISON:  CT head and cervical spine 05/12/2017 FINDINGS: CT HEAD FINDINGS Brain: There is no evidence of an acute infarct, intracranial hemorrhage, mass, midline shift, or extra-axial fluid collection. The ventricles and sulci are normal. Vascular: No hyperdense vessel. Skull: No acute fracture or suspicious osseous lesion. Sinuses/Orbits: Paranasal sinuses and mastoid air cells are clear. Unremarkable orbits. Other: None. Review of the MIP images confirms  the above findings CTA NECK FINDINGS Aortic arch: Standard 3 vessel aortic arch. Widely patent arch vessel origins. Right carotid system: Patent without evidence of stenosis or dissection. Left carotid system: Patent without evidence of stenosis or dissection. Vertebral arteries: Patent without evidence of stenosis or dissection. Dominant left vertebral artery. Skeleton: No acute osseous abnormality or suspicious osseous lesion. Other neck: Scattered mildly prominent bilateral cervical and intraparotid lymph nodes, all subcentimeter in short axis, similar to what was included on the prior cervical spine CT, and nonspecific. Upper chest: Clear lung apices. Review of the MIP images confirms the above findings CTA HEAD FINDINGS Anterior circulation: The internal carotid arteries are widely patent from skull base to carotid termini. ACAs and MCAs are patent with mild branch vessel irregularity but no evidence of  a proximal branch occlusion or significant proximal stenosis. No aneurysm is identified. Posterior circulation: Intracranial vertebral arteries are widely patent to the basilar. Patent left PICA, right AICA, and bilateral SCA origins are visualized. The basilar artery is widely patent. There are small posterior communicating arteries bilaterally. Both PCAs are patent without evidence of a flow limiting proximal stenosis. No aneurysm is identified. Venous sinuses: Patent. Anatomic variants: None. Review of the MIP images confirms the above findings IMPRESSION: 1. Unremarkable CT appearance of the brain. 2. No large vessel occlusion, significant stenosis, aneurysm, or dissection in the head or neck. Electronically Signed   By: Sebastian Ache M.D.   On: 09/27/2022 17:35    Procedures Procedures    Medications Ordered in ED Medications  iohexol (OMNIPAQUE) 350 MG/ML injection 80 mL (80 mLs Intravenous Contrast Given 09/27/22 1601)  HYDROcodone-acetaminophen (NORCO/VICODIN) 5-325 MG per tablet 1 tablet (1  tablet Oral Given 09/27/22 1739)    ED Course/ Medical Decision Making/ A&P                             Medical Decision Making Amount and/or Complexity of Data Reviewed Radiology: ordered.  Risk Prescription drug management.   Patient is a 47 year old female who presents with pain in the back of her head.  She seems to have some radiation down her neck.  She has no associated symptoms.  She initially said it felt kind like a deep pain and was worse with certain movements.  I felt that it would be prudent to get a CTA of her head and neck to assess the this was negative.  Labs are nonconcerning.  She does not have any point tenderness in her neck which we more concerning for radicular pain.  On reexam, seems to be localized tenderness in 1 small area of her scalp.  No visual redness, no fluctuance or induration.  I suspect she has some local injury or inflammation to the area.  No clear concerns for infection.  She was advised to observe it.  She was advised to use ibuprofen or Tylenol for symptomatic relief.  She was advised to follow-up this week with her primary care doctor for recheck.  It was noted that her blood pressure was elevated consistently throughout the ED stay.  Will go ahead and start her amlodipine.  Advised her to check her blood pressures at home and follow-up with her primary care doctor with this.  She was discharged home in good condition.  Return precautions were given.  Final Clinical Impression(s) / ED Diagnoses Final diagnoses:  Acute nonintractable headache, unspecified headache type  Uncontrolled hypertension    Rx / DC Orders ED Discharge Orders          Ordered    amLODipine (NORVASC) 5 MG tablet  Daily        09/27/22 1753              Rolan Bucco, MD 09/27/22 1756

## 2022-09-27 NOTE — ED Triage Notes (Signed)
Pt arrived via POV, c/o headache, neck pain radiating to back and shoulders. States concerned she pulled something while moving furniture
